# Patient Record
Sex: Male | Born: 1985 | Hispanic: Yes | Marital: Single | Smoking: Light tobacco smoker
Health system: Southern US, Community
[De-identification: ages and names within clinical notes are randomized; demographics above are authoritative.]

---

## 2017-09-16 ENCOUNTER — Ambulatory Visit
Admission: RE | Admit: 2017-09-16 | Discharge: 2017-09-16 | Disposition: A | Discharge status: Home / Self Care | Payer: BLUE CROSS/BLUE SHIELD | Source: Ambulatory Visit | Attending: Internal Medicine | Admitting: Internal Medicine

## 2017-09-16 ENCOUNTER — Encounter: Payer: Self-pay | Admitting: Internal Medicine

## 2017-09-16 ENCOUNTER — Ambulatory Visit (INDEPENDENT_AMBULATORY_CARE_PROVIDER_SITE_OTHER): Payer: BLUE CROSS/BLUE SHIELD | Admitting: Internal Medicine

## 2017-09-16 VITALS — BP 120/84 | HR 77 | Temp 98.0°F | Resp 16 | Ht 62.0 in | Wt 197.0 lb

## 2017-09-16 DIAGNOSIS — M25561 Pain in right knee: Secondary | ICD-10-CM | POA: Diagnosis not present

## 2017-09-16 DIAGNOSIS — G8929 Other chronic pain: Secondary | ICD-10-CM

## 2017-09-16 DIAGNOSIS — S8991XA Unspecified injury of right lower leg, initial encounter: Secondary | ICD-10-CM | POA: Diagnosis not present

## 2017-09-16 NOTE — Progress Notes (Signed)
Date:  09/16/2017   Name:  Clarence Garcia   DOB:  06/29/1985   MRN:  [id number]   Chief Complaint: Establish Care and Knee Pain Knee Pain   Incident onset: one year ago while playing basketball. [place] The injury mechanism was a twisting injury. The pain is present in the right knee. The quality of the pain is described as aching and cramping. The pain is moderate. The pain has been fluctuating since onset. Pertinent negatives include no inability to bear weight, muscle weakness, numbness or tingling. He reports no foreign bodies present. The symptoms are aggravated by weight bearing. He has tried NSAIDs for the symptoms. The treatment provided mild relief.      Review of Systems  Constitutional: Negative for chills, fatigue and fever.  Respiratory: Negative for choking and shortness of breath.   Cardiovascular: Negative for chest pain, palpitations and leg swelling.  Musculoskeletal: Positive for arthralgias. Negative for joint swelling and myalgias.  Neurological: Negative for dizziness, tingling and numbness.  Psychiatric/Behavioral: Negative for sleep disturbance.    There are no active problems to display for this patient.   Prior to Admission medications   Medication Sig Start Date End Date Taking? Authorizing Provider  ibuprofen (ADVIL,MOTRIN) 200 MG tablet Take 200 mg by mouth every 6 (six) hours as needed.   Yes [provider]    No Known Allergies  History reviewed. No pertinent surgical history.  Social History   Tobacco Use  . Smoking status: Light Tobacco Smoker    Years: 15.00    Types: Cigarettes  . Smokeless tobacco: Never Used  . Tobacco comment: rare   Substance Use Topics  . Alcohol use: Yes    Comment: social   . Drug use: Never     Medication list has been reviewed and updated.  Current Meds  Medication Sig  . ibuprofen (ADVIL,MOTRIN) 200 MG tablet Take 200 mg by mouth every 6 (six) hours as needed.     PHQ 2/9 Scores 09/16/2017  PHQ - 2 Score 0    Physical Exam  Constitutional: He is oriented to person, place, and time. He appears well-developed. No distress.  HENT:  Head: Normocephalic and atraumatic.  Neck: Normal range of motion. Neck supple.  Cardiovascular: Normal rate, regular rhythm and normal heart sounds.  Pulmonary/Chest: Effort normal and breath sounds normal. No respiratory distress.  Musculoskeletal: Normal range of motion.       Right knee: He exhibits normal range of motion, no swelling, no effusion, normal patellar mobility and no MCL laxity. Tenderness found. Medial joint line tenderness noted.       Left knee: Normal.  Neurological: He is alert and oriented to person, place, and time.  Skin: Skin is warm and dry. No rash noted.  Psychiatric: He has a normal mood and affect. His behavior is normal. Thought content normal.  Nursing note and vitals reviewed.   BP 120/84   Pulse 77   Temp 98 F (36.7 C) (Oral)   Resp 16   Ht 5\' 2"  (1.575 m)   Wt 197 lb (89.4 kg)   SpO2 99%   BMI 36.03 kg/m   Assessment and Plan: 1. Chronic pain of right knee Continue Advil Continue brace with physical activity Refer to Ortho after xray results - DG Knee Complete 4 Views Right; Future   No orders of the defined types were placed in this encounter.   Partially dictated using Animal nutritionistDragon software. Any errors are  unintentional.  Bari Edward, MD [facility]  09/16/2017   There are no diagnoses linked to this encounter.

## 2020-10-03 ENCOUNTER — Other Ambulatory Visit: Payer: Self-pay

## 2020-10-03 ENCOUNTER — Emergency Department
Admission: EM | Admit: 2020-10-03 | Discharge: 2020-10-03 | Disposition: A | Discharge status: Home / Self Care | Payer: BC Managed Care – PPO | Attending: Emergency Medicine | Admitting: Emergency Medicine

## 2020-10-03 ENCOUNTER — Emergency Department: Payer: BC Managed Care – PPO

## 2020-10-03 DIAGNOSIS — M25462 Effusion, left knee: Secondary | ICD-10-CM | POA: Diagnosis not present

## 2020-10-03 DIAGNOSIS — F17210 Nicotine dependence, cigarettes, uncomplicated: Secondary | ICD-10-CM | POA: Insufficient documentation

## 2020-10-03 DIAGNOSIS — M25562 Pain in left knee: Secondary | ICD-10-CM

## 2020-10-03 MED ORDER — ETODOLAC 400 MG PO TABS: 400.0000 mg | ORAL_TABLET | Freq: Two times a day (BID) | ORAL | 0 refills | Status: AC

## 2020-10-03 MED ORDER — IBUPROFEN 600 MG PO TABS
600.0000 mg | ORAL_TABLET | Freq: Once | ORAL | Status: AC
  Administered 2020-10-03: 600 mg via ORAL
  Filled 2020-10-03: qty 1

## 2020-10-03 NOTE — ED Notes (Addendum)
See triage note  Presents with pain to left knee since yesterday  Denies any fall  States pain to lateral and posterior  Unable to bear full wt

## 2020-10-03 NOTE — ED Provider Notes (Signed)
[facility] Emergency Department Provider Note  ____________________________________________   Event Date/Time   First MD Initiated Contact with Patient 10/03/20 [phone]     (approximate)  I have reviewed the triage vital signs and the nursing notes.   HISTORY  Chief Complaint Knee Pain   HPI Clarence Garcia is a 35 y.o. male presents to the ED with complaint of left knee pain without recent injury.  Patient states he had an injury to his knee 5 years ago while playing sports.  He has not seen a orthopedist for his injury and states now it is "flared up".  Patient has been taking Tylenol without any relief.  He also has used a very small over-the-counter knee sleeve without improvement.  Rates his pain as an 8 out of 10.         History reviewed. No pertinent past medical history.  Patient Active Problem List   Diagnosis Date Noted   Chronic pain of right knee 09/16/2017    History reviewed. No pertinent surgical history.  Prior to Admission medications   Medication Sig Start Date End Date Taking? Authorizing Provider  etodolac (LODINE) 400 MG tablet Take 1 tablet (400 mg total) by mouth 2 (two) times daily. 10/03/20  Yes Tommi Rumps, PA-C    Allergies Patient has no known allergies.  Family History  Family history unknown: Yes    Social History Social History   Tobacco Use   Smoking status: Light Smoker    Years: 15.00    Types: Cigarettes   Smokeless tobacco: Never   Tobacco comments:    rare   Vaping Use   Vaping Use: Never used  Substance Use Topics   Alcohol use: Yes    Alcohol/week: 1.0 standard drink    Types: 1 Cans of beer per week    Comment: social    Drug use: Never    Review of Systems Constitutional: No fever/chills Eyes: No visual changes. Cardiovascular: Denies chest pain. Respiratory: Denies shortness of breath. Gastrointestinal: No abdominal pain.  No nausea, no vomiting.   Musculoskeletal: Positive  left knee pain. Skin: Negative for rash. Neurological: Negative for headaches, focal weakness or numbness. ____________________________________________   PHYSICAL EXAM:  VITAL SIGNS: ED Triage Vitals  Enc Vitals Group     BP 10/03/20 0756 (!) 151/108     Pulse Rate 10/03/20 0756 88     Resp 10/03/20 0756 15     Temp 10/03/20 0756 98.8 F (37.1 C)     Temp Source 10/03/20 0756 Oral     SpO2 10/03/20 0756 99 %     Weight 10/03/20 0757 150 lb (68 kg)     Height 10/03/20 0757 5\' 2"  (1.575 m)     Head Circumference --      Peak Flow --      Pain Score 10/03/20 0758 8     Pain Loc --      Pain Edu? --      Excl. in GC? --     Constitutional: Alert and oriented. Well appearing and in no acute distress. Eyes: Conjunctivae are normal. PERRL. EOMI. Head: Atraumatic. Neck: No stridor.   Cardiovascular: Normal rate, regular rhythm. Grossly normal heart sounds.  Good peripheral circulation. Respiratory: Normal respiratory effort.  No retractions. Lungs CTAB. Musculoskeletal: Examination of left knee there is no gross deformity however there is some moderate tenderness on palpation of the patella with minimal soft tissue edema.  Questionable effusion present.  Range of  motion is slow and guarded secondary to increased pain.  Skin is warm and dry and no erythema or warmth is noted.  Skin intact. Neurologic:  Normal speech and language. No gross focal neurologic deficits are appreciated. No gait instability. Skin:  Skin is warm, dry and intact.  Psychiatric: Mood and affect are normal. Speech and behavior are normal.  ____________________________________________   LABS (all labs ordered are listed, but only abnormal results are displayed)  Labs Reviewed - No data to display ____________________________________________  ___________________________________________  RADIOLOGY Beaulah Corin, personally viewed and evaluated these images (plain radiographs) as part of my medical  decision making, as well as reviewing the written report by the radiologist.    Official radiology report(s): DG Knee Complete 4 Views Left  Result Date: 10/03/2020 CLINICAL DATA:  Chronic pain. EXAM: LEFT KNEE - COMPLETE 4+ VIEW COMPARISON:  No recent prior. FINDINGS: Probable knee joint effusion. 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 No evidence of fracture dislocation. No acute bony abnormality identified. IMPRESSION: Probable knee joint effusion. 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 No acute bony abnormality identified. Electronically Signed   By: Maisie Fus  Register   On: 10/03/2020 08:39    ____________________________________________   PROCEDURES  Procedure(s) performed (including Critical Care):  Procedures   ____________________________________________   INITIAL IMPRESSION / ASSESSMENT AND PLAN / ED COURSE  As part of my medical decision making, I reviewed the following data within the electronic MEDICAL RECORD NUMBER Notes from prior ED visits and [place] Controlled Substance Database  35 year old male presents to the ED with complaint of left knee pain without recent injury.  Patient states that he had an injury to his knee 5 years ago and now it is "flaring up".  He reports at the time of his injury he was never seen by orthopedics.  Moderate tenderness on palpation.  X-ray shows some effusion with medial compartment degenerative change.  Patient was made aware.  A knee immobilizer was applied and patient was given a prescription for etodolac with instructions to take Tylenol if additional pain medication is needed.  He is to call make an appointment with Dr. Martha Clan who is the orthopedist on-call for further evaluation of his knee pain.   ____________________________________________   FINAL CLINICAL IMPRESSION(S) / ED DIAGNOSES  Final diagnoses:  Effusion of left knee  Acute pain of left knee     ED Discharge Orders           Ordered    etodolac (LODINE) 400 MG tablet  2 times daily        10/03/20 8338             Note:  This document was prepared using Dragon voice recognition software and may include unintentional dictation errors.    Tommi Rumps, PA-C 10/03/20 1620    Sharyn Creamer, MD 10/03/20 1622

## 2020-10-03 NOTE — Discharge Instructions (Addendum)
Call make an appoint with Dr. Martha Clan who is the orthopedist on-call.  Wear the knee immobilizer anytime you are up walking for protection and support.  You do not have to wear this while sleeping.  Begin taking etodolac 400 mg twice a day with food.  You may take Tylenol with this medication if additional pain medication is needed.  Ice and elevation when at home.

## 2020-10-03 NOTE — ED Triage Notes (Signed)
Pt c/o left knee pain since yesterday, states he injured it years ago and it just flared up, denies any recent injury

## 2022-02-01 IMAGING — DX DG KNEE COMPLETE 4+V*L*
4 series · 4 of 4 positions shown · non-contrast
Comparison: No recent prior.

CLINICAL DATA: Chronic pain.

EXAM:
LEFT KNEE - COMPLETE 4+ VIEW

[knee ap]
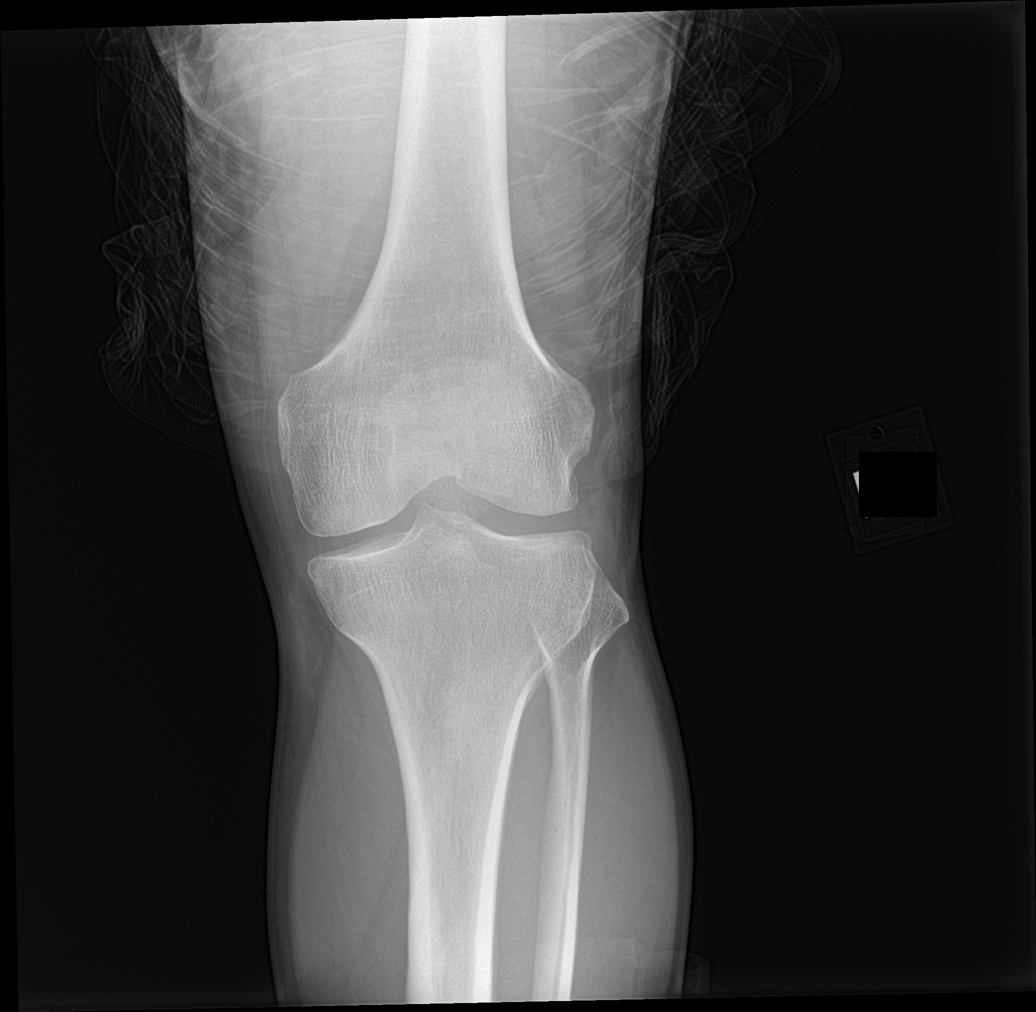

[knee lat]
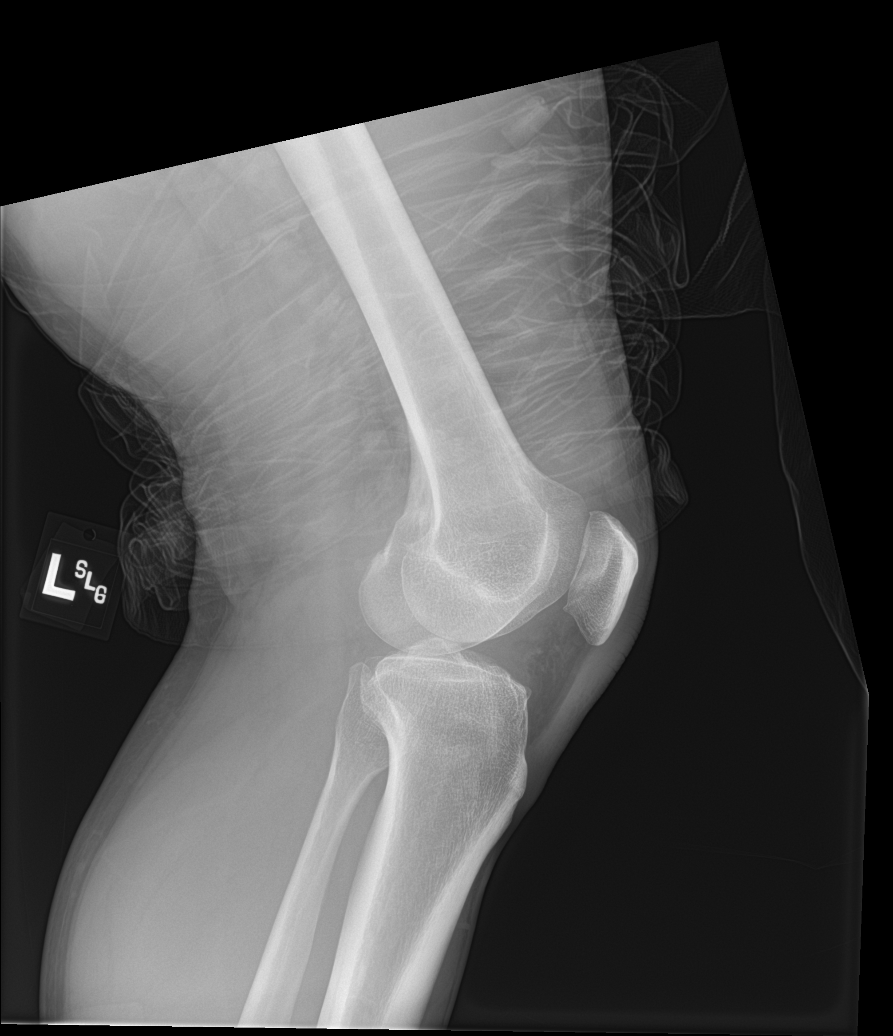

[knee obl (1 of 2)]
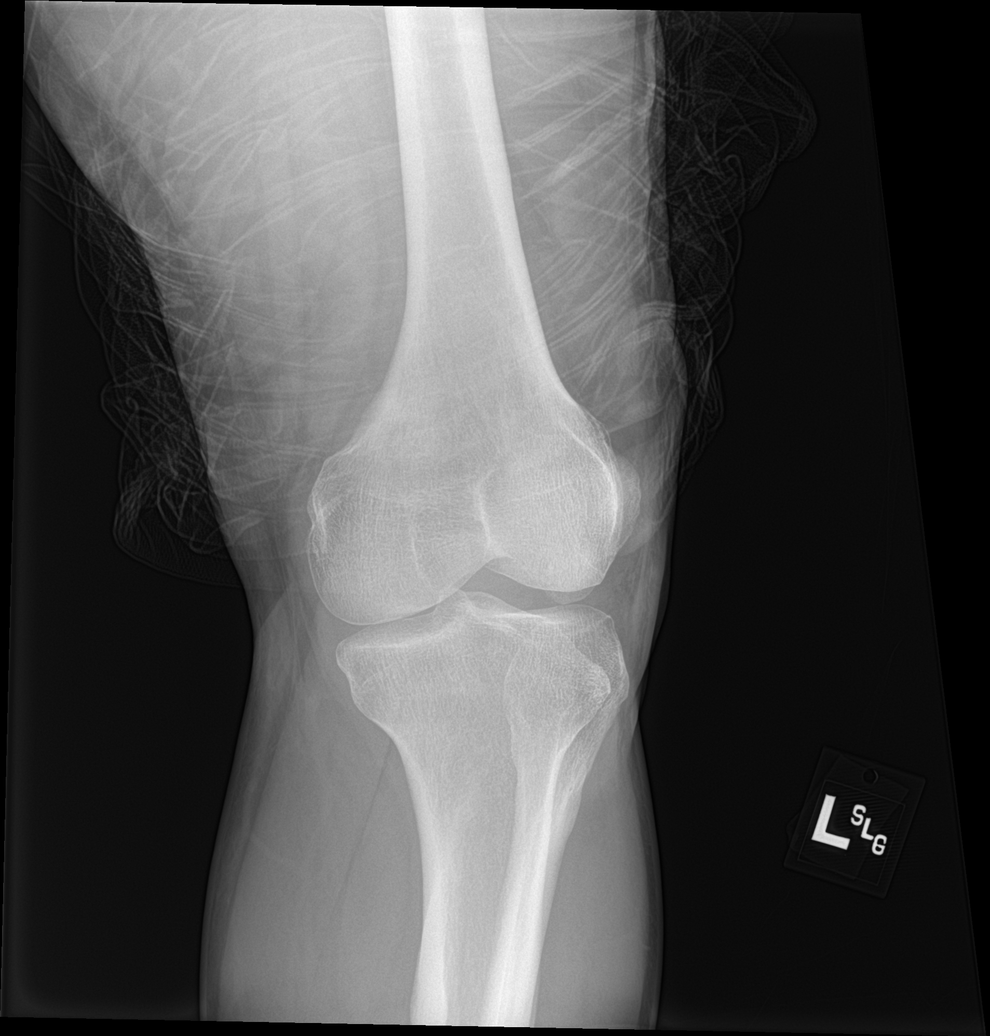

[knee obl (2 of 2)]
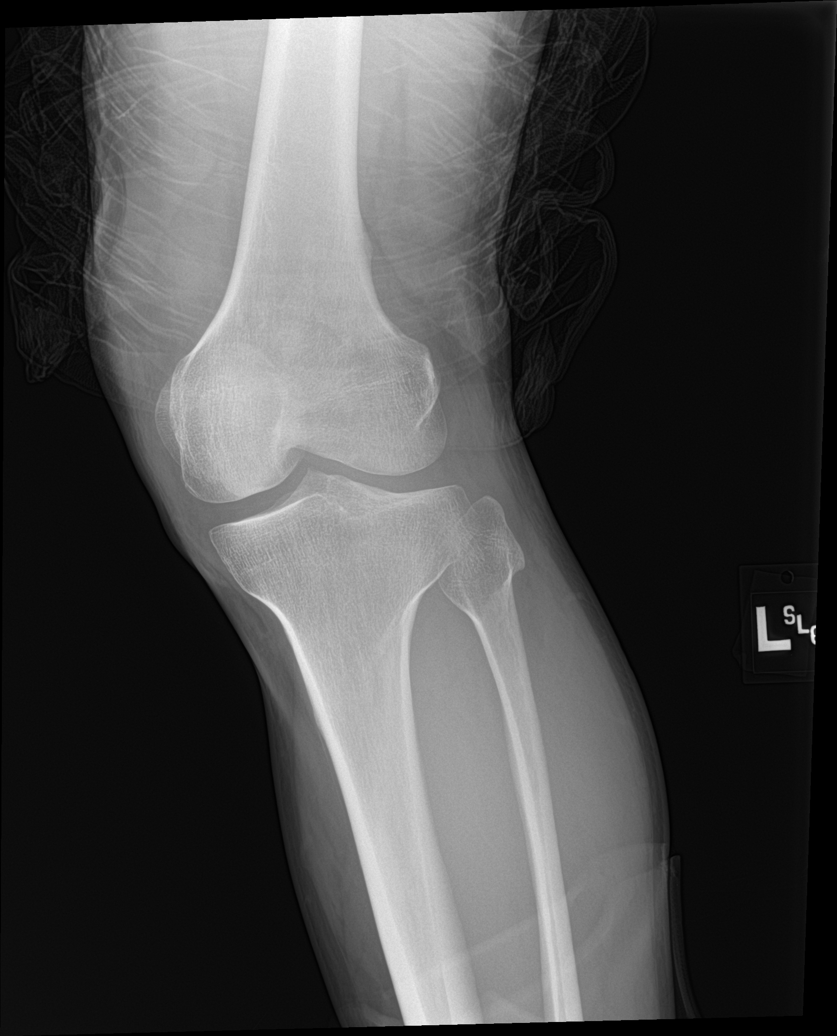

[4 of 4 positions shown; findings below may reference images not displayed]

FINDINGS: Probable knee joint effusion. Mild patellofemoral and medial
compartment degenerative change. No evidence of fracture
dislocation. No acute bony abnormality identified.
IMPRESSION: Probable knee joint effusion. Mild patellofemoral and medial
compartment degenerative change. No acute bony abnormality
identified.
